# Patient Record
Sex: Male | Born: 1970 | Race: White | Hispanic: No | Marital: Married | Smoking: Never smoker
Health system: Southern US, Community
[De-identification: ages and names within clinical notes are randomized; demographics above are authoritative.]

## PROBLEM LIST (undated history)

## (undated) DIAGNOSIS — I10 Essential (primary) hypertension: Secondary | ICD-10-CM

---

## 2002-12-31 ENCOUNTER — Ambulatory Visit (HOSPITAL_COMMUNITY): Admission: RE | Admit: 2002-12-31 | Discharge: 2002-12-31 | Payer: Self-pay | Admitting: *Deleted

## 2002-12-31 ENCOUNTER — Encounter: Payer: Self-pay | Admitting: *Deleted

## 2009-07-31 ENCOUNTER — Ambulatory Visit: Payer: Self-pay | Admitting: Cardiology

## 2013-10-31 ENCOUNTER — Other Ambulatory Visit: Payer: Self-pay | Admitting: Occupational Medicine

## 2013-10-31 ENCOUNTER — Ambulatory Visit: Payer: Self-pay

## 2013-10-31 DIAGNOSIS — Z021 Encounter for pre-employment examination: Secondary | ICD-10-CM

## 2018-09-12 DIAGNOSIS — Z23 Encounter for immunization: Secondary | ICD-10-CM | POA: Diagnosis not present

## 2018-12-05 ENCOUNTER — Encounter (HOSPITAL_COMMUNITY): Payer: Self-pay | Admitting: Emergency Medicine

## 2018-12-05 ENCOUNTER — Emergency Department (HOSPITAL_COMMUNITY): Payer: BLUE CROSS/BLUE SHIELD

## 2018-12-05 ENCOUNTER — Emergency Department (HOSPITAL_COMMUNITY)
Admission: EM | Admit: 2018-12-05 | Discharge: 2018-12-05 | Disposition: A | Discharge status: Home / Self Care | Payer: BLUE CROSS/BLUE SHIELD | Attending: Emergency Medicine | Admitting: Emergency Medicine

## 2018-12-05 DIAGNOSIS — Z23 Encounter for immunization: Secondary | ICD-10-CM | POA: Diagnosis not present

## 2018-12-05 DIAGNOSIS — Y999 Unspecified external cause status: Secondary | ICD-10-CM | POA: Insufficient documentation

## 2018-12-05 DIAGNOSIS — I10 Essential (primary) hypertension: Secondary | ICD-10-CM | POA: Insufficient documentation

## 2018-12-05 DIAGNOSIS — S12691A Other nondisplaced fracture of seventh cervical vertebra, initial encounter for closed fracture: Secondary | ICD-10-CM | POA: Insufficient documentation

## 2018-12-05 DIAGNOSIS — Y939 Activity, unspecified: Secondary | ICD-10-CM | POA: Diagnosis not present

## 2018-12-05 DIAGNOSIS — R220 Localized swelling, mass and lump, head: Secondary | ICD-10-CM | POA: Insufficient documentation

## 2018-12-05 DIAGNOSIS — Y92410 Unspecified street and highway as the place of occurrence of the external cause: Secondary | ICD-10-CM | POA: Insufficient documentation

## 2018-12-05 DIAGNOSIS — S199XXA Unspecified injury of neck, initial encounter: Secondary | ICD-10-CM | POA: Diagnosis present

## 2018-12-05 DIAGNOSIS — Z79899 Other long term (current) drug therapy: Secondary | ICD-10-CM | POA: Diagnosis not present

## 2018-12-05 DIAGNOSIS — S12601A Unspecified nondisplaced fracture of seventh cervical vertebra, initial encounter for closed fracture: Secondary | ICD-10-CM

## 2018-12-05 HISTORY — DX: Essential (primary) hypertension: I10

## 2018-12-05 MED ORDER — BACITRACIN ZINC 500 UNIT/GM EX OINT
1.0000 "application " | TOPICAL_OINTMENT | Freq: Once | CUTANEOUS | Status: AC
  Administered 2018-12-05: 1 via TOPICAL
  Filled 2018-12-05: qty 0.9

## 2018-12-05 MED ORDER — ACETAMINOPHEN 500 MG PO TABS
1000.0000 mg | ORAL_TABLET | Freq: Once | ORAL | Status: AC
  Administered 2018-12-05: 1000 mg via ORAL
  Filled 2018-12-05: qty 2

## 2018-12-05 MED ORDER — TETANUS-DIPHTH-ACELL PERTUSSIS 5-2.5-18.5 LF-MCG/0.5 IM SUSP
0.5000 mL | Freq: Once | INTRAMUSCULAR | Status: AC
  Administered 2018-12-05: 0.5 mL via INTRAMUSCULAR
  Filled 2018-12-05: qty 0.5

## 2018-12-05 MED ORDER — HYDROCODONE-ACETAMINOPHEN 5-325 MG PO TABS: 2.0000 | ORAL_TABLET | Freq: Four times a day (QID) | ORAL | 0 refills | Status: AC | PRN
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
## 2018-12-06 DIAGNOSIS — I10 Essential (primary) hypertension: Secondary | ICD-10-CM | POA: Diagnosis not present

## 2018-12-06 DIAGNOSIS — S12601A Unspecified nondisplaced fracture of seventh cervical vertebra, initial encounter for closed fracture: Secondary | ICD-10-CM | POA: Diagnosis not present

## 2018-12-06 DIAGNOSIS — Z6834 Body mass index (BMI) 34.0-34.9, adult: Secondary | ICD-10-CM | POA: Diagnosis not present

## 2019-01-01 DIAGNOSIS — S12601A Unspecified nondisplaced fracture of seventh cervical vertebra, initial encounter for closed fracture: Secondary | ICD-10-CM | POA: Diagnosis not present

## 2019-01-04 DIAGNOSIS — R9431 Abnormal electrocardiogram [ECG] [EKG]: Secondary | ICD-10-CM | POA: Diagnosis not present

## 2019-01-04 DIAGNOSIS — I10 Essential (primary) hypertension: Secondary | ICD-10-CM | POA: Diagnosis not present

## 2019-02-18 ENCOUNTER — Other Ambulatory Visit: Payer: Self-pay | Admitting: Neurological Surgery

## 2019-02-18 DIAGNOSIS — S12601A Unspecified nondisplaced fracture of seventh cervical vertebra, initial encounter for closed fracture: Secondary | ICD-10-CM

## 2019-02-26 ENCOUNTER — Ambulatory Visit
Admission: RE | Admit: 2019-02-26 | Discharge: 2019-02-26 | Disposition: A | Discharge status: Home / Self Care | Payer: BLUE CROSS/BLUE SHIELD | Source: Ambulatory Visit | Attending: Neurological Surgery | Admitting: Neurological Surgery

## 2019-02-26 DIAGNOSIS — S12601A Unspecified nondisplaced fracture of seventh cervical vertebra, initial encounter for closed fracture: Secondary | ICD-10-CM

## 2019-03-05 DIAGNOSIS — S12601A Unspecified nondisplaced fracture of seventh cervical vertebra, initial encounter for closed fracture: Secondary | ICD-10-CM | POA: Diagnosis not present

## 2019-07-01 ENCOUNTER — Other Ambulatory Visit: Payer: Self-pay | Admitting: Neurological Surgery

## 2019-07-01 DIAGNOSIS — S12601A Unspecified nondisplaced fracture of seventh cervical vertebra, initial encounter for closed fracture: Secondary | ICD-10-CM

## 2019-07-08 ENCOUNTER — Ambulatory Visit
Admission: RE | Admit: 2019-07-08 | Discharge: 2019-07-08 | Disposition: A | Discharge status: Home / Self Care | Payer: BLUE CROSS/BLUE SHIELD | Source: Ambulatory Visit | Attending: Neurological Surgery | Admitting: Neurological Surgery

## 2019-07-08 DIAGNOSIS — S12601A Unspecified nondisplaced fracture of seventh cervical vertebra, initial encounter for closed fracture: Secondary | ICD-10-CM

## 2019-07-08 DIAGNOSIS — S12601D Unspecified nondisplaced fracture of seventh cervical vertebra, subsequent encounter for fracture with routine healing: Secondary | ICD-10-CM | POA: Diagnosis not present

## 2019-07-16 DIAGNOSIS — M542 Cervicalgia: Secondary | ICD-10-CM | POA: Diagnosis not present

## 2019-07-16 DIAGNOSIS — S12601A Unspecified nondisplaced fracture of seventh cervical vertebra, initial encounter for closed fracture: Secondary | ICD-10-CM | POA: Diagnosis not present

## 2019-08-13 IMAGING — CT CT CERVICAL SPINE WITHOUT CONTRAST
5 series · 16 of 33 positions shown, 18 images · non-contrast
Comparison: 12/05/2018

CLINICAL DATA: C7 fracture sustained 12/05/2018.

EXAM:
CT CERVICAL SPINE WITHOUT CONTRAST
TECHNIQUE: Multidetector CT imaging of the cervical spine was performed without
intravenous contrast. Multiplanar CT image reconstructions were also
generated.

[Series 3: c-spine 2.00 br60 s3 axial bone · axial · 0.35mm/px · z∈[-753,-657]mm · 3 of 96 slices shown, 4 images]
[im 24/96  soft-tissue]
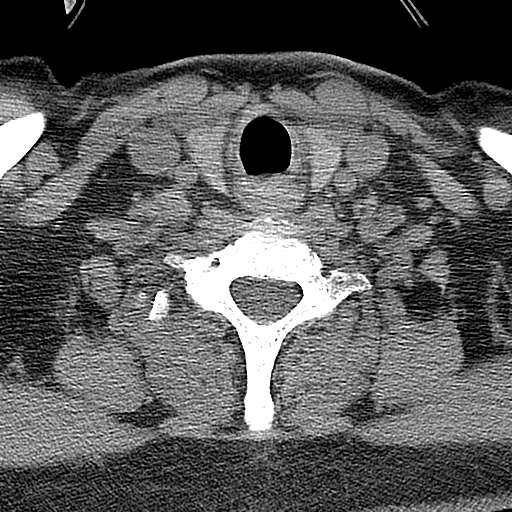
[im 24/96  bone]
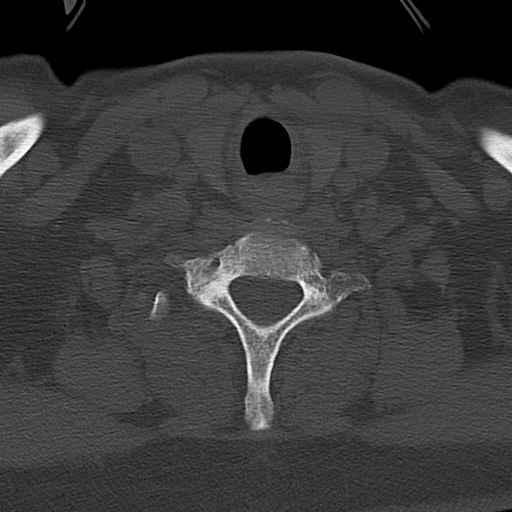
[im 48/96  bone]
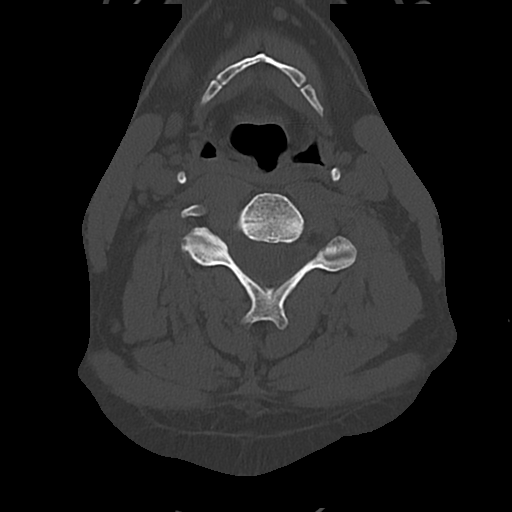
[im 72/96  bone]
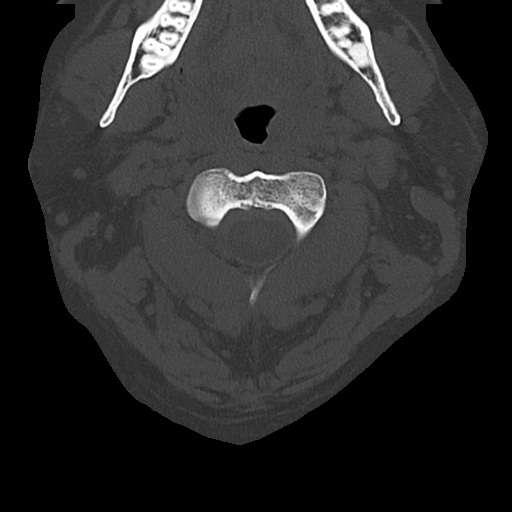

[Series 4: c-spine 2.00 br40 s3 axial (person_name) · axial · 0.35mm/px · z∈[-753,-657]mm · 3 of 96 slices shown]
[im 24/96  bone]
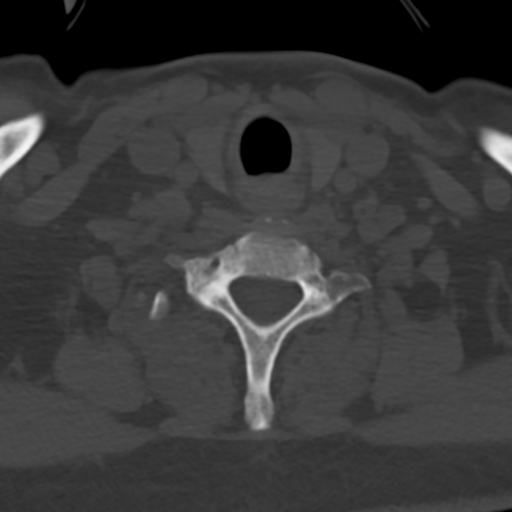
[im 48/96  bone]
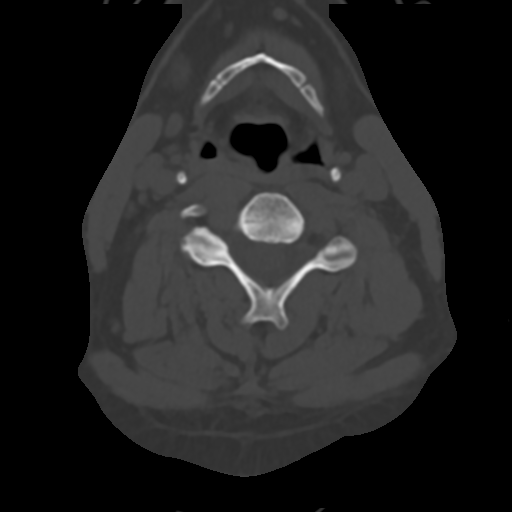
[im 72/96  bone]
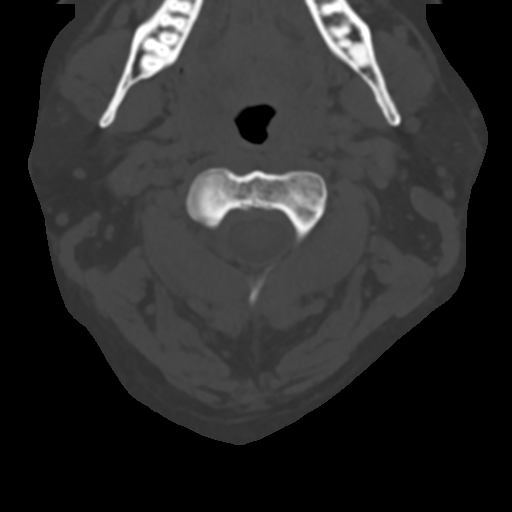

[Series 5: c-spine 2.00 br60 s3 sag sag bone · sagittal · 0.32mm/px · 5 of 90 slices shown, 6 images]
[im 30/90  bone]
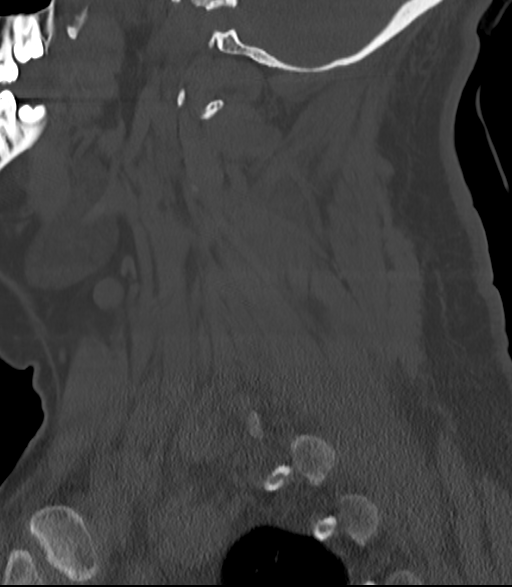
[im 38/90  bone]
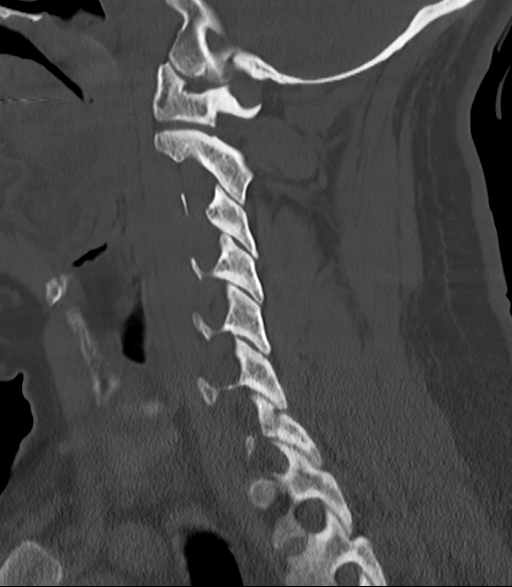
[im 45/90  soft-tissue]
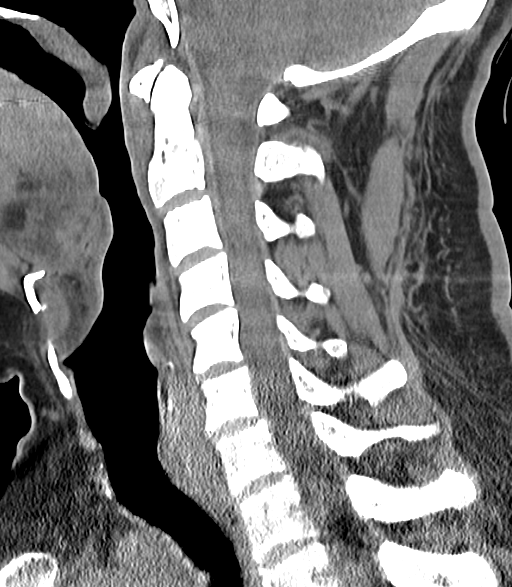
[im 45/90  bone]
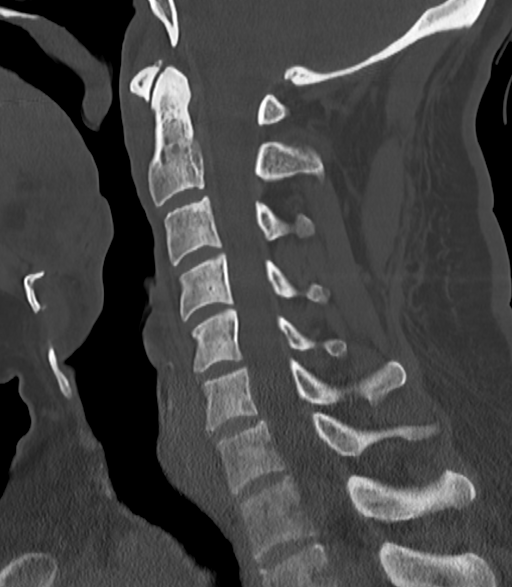
[im 52/90  bone]
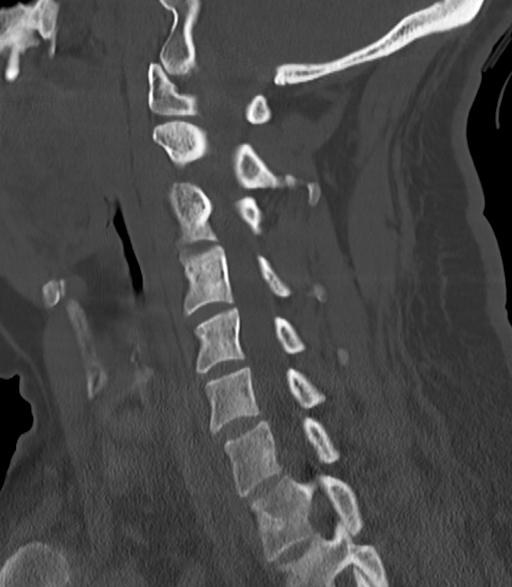
[im 60/90  bone]
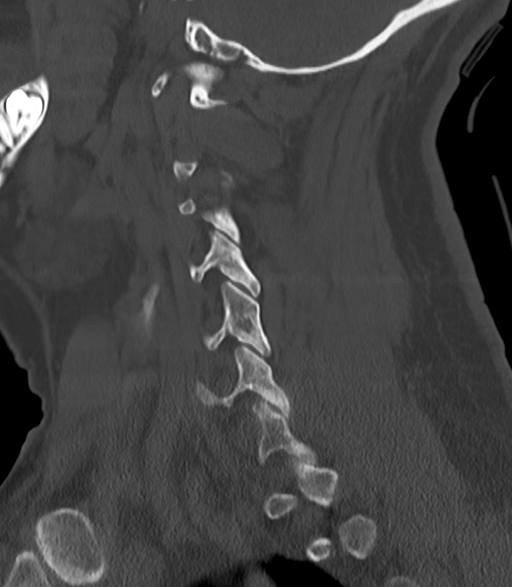

[Series 7: c-spine 2.00 hr60 s3 cor cor bone · coronal · 0.35mm/px · 3 of 79 slices shown]
[im 16/79  bone]
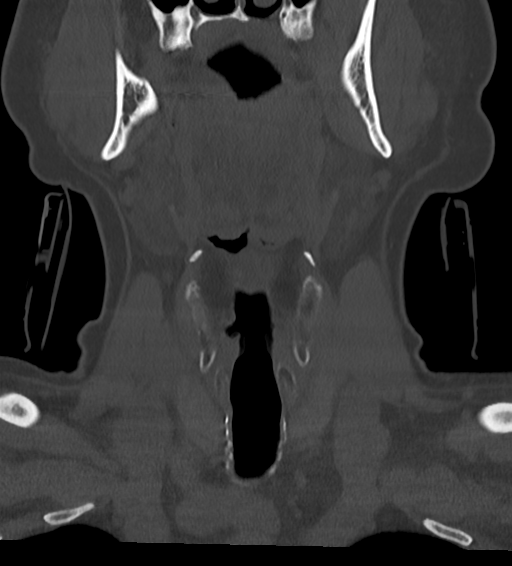
[im 32/79  bone]
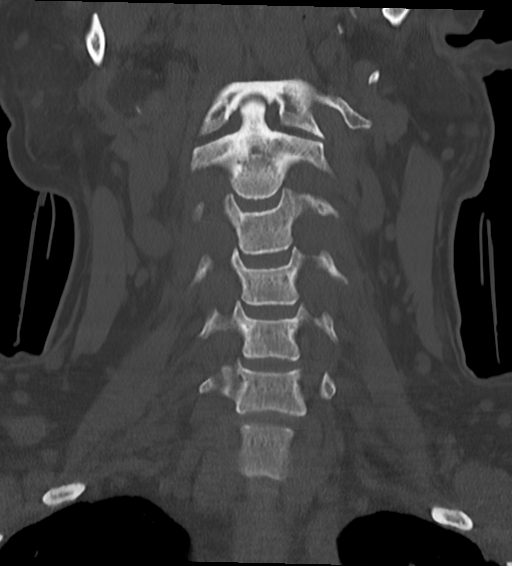
[im 47/79  bone]
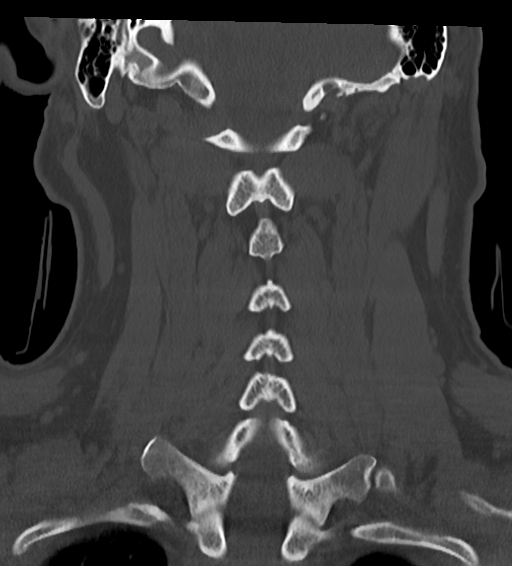

[Series 10: c-spine 2.00 hr60 s3 axial orthogonal axial · axial · 0.31mm/px · z∈[-753,-709]mm · 2 of 89 slices shown]
[im 30/89  bone]
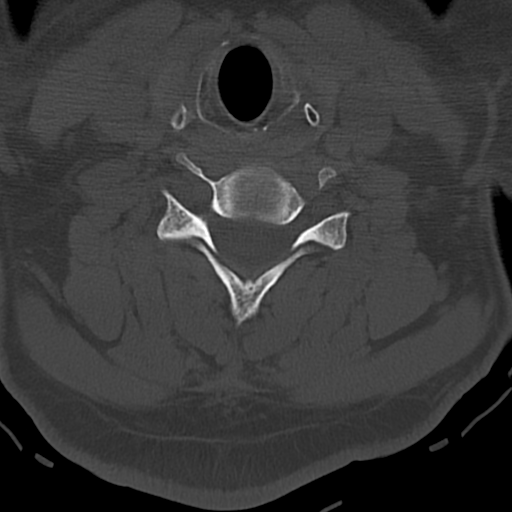
[im 59/89  bone]
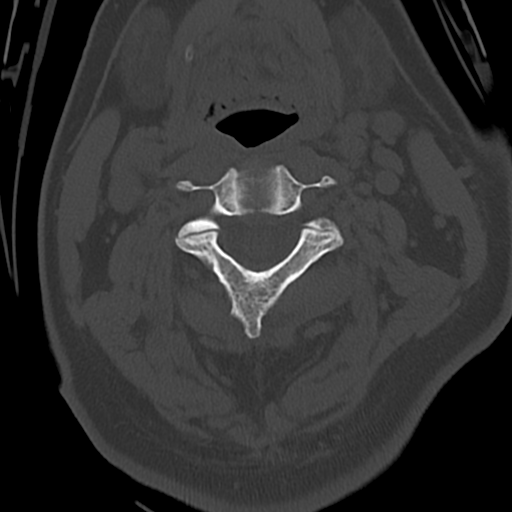

[16 of 33 positions shown; findings below may reference images not displayed]

FINDINGS: Alignment: Normal

Skull base and vertebrae: Nondisplaced fracture of the right facet
complex at C7 is unchanged in position and alignment and the
fracture line is becoming indistinct consistent with healing. No
complicating feature. No other finding.

Soft tissues and spinal canal: Negative

Disc levels:  No significant degenerative disease.

Upper chest: Negative

Other: None
IMPRESSION: Nondisplaced fracture of the right facet at C7 shows the fracture
line becoming indistinct consistent with the healing process. No
evidence of displacement or other complicating feature.

## 2019-09-17 DIAGNOSIS — Z23 Encounter for immunization: Secondary | ICD-10-CM | POA: Diagnosis not present

## 2019-10-23 ENCOUNTER — Ambulatory Visit: Payer: BC Managed Care – PPO | Attending: Internal Medicine

## 2019-10-23 ENCOUNTER — Other Ambulatory Visit: Payer: Self-pay

## 2019-10-23 DIAGNOSIS — Z20822 Contact with and (suspected) exposure to covid-19: Secondary | ICD-10-CM

## 2019-10-24 LAB — NOVEL CORONAVIRUS, NAA: SARS-CoV-2, NAA: NOT DETECTED

## 2023-09-11 DIAGNOSIS — H524 Presbyopia: Secondary | ICD-10-CM | POA: Diagnosis not present

## 2023-09-11 DIAGNOSIS — H5213 Myopia, bilateral: Secondary | ICD-10-CM | POA: Diagnosis not present
